# Patient Record
Sex: Female | Born: 1970 | Race: White | Hispanic: No | Marital: Single
Health system: Southern US, Community
[De-identification: ages and names within clinical notes are randomized; demographics above are authoritative.]

---

## 2019-12-28 ENCOUNTER — Emergency Department (HOSPITAL_COMMUNITY): Payer: BC Managed Care – PPO

## 2019-12-28 ENCOUNTER — Other Ambulatory Visit: Payer: Self-pay

## 2019-12-28 ENCOUNTER — Emergency Department (HOSPITAL_COMMUNITY)
Admission: EM | Admit: 2019-12-28 | Discharge: 2019-12-28 | Disposition: A | Discharge status: Home / Self Care | Payer: BC Managed Care – PPO | Attending: Emergency Medicine | Admitting: Emergency Medicine

## 2019-12-28 ENCOUNTER — Encounter (HOSPITAL_COMMUNITY): Payer: Self-pay

## 2019-12-28 DIAGNOSIS — N200 Calculus of kidney: Secondary | ICD-10-CM | POA: Insufficient documentation

## 2019-12-28 DIAGNOSIS — Z91040 Latex allergy status: Secondary | ICD-10-CM | POA: Insufficient documentation

## 2019-12-28 DIAGNOSIS — R1031 Right lower quadrant pain: Secondary | ICD-10-CM | POA: Diagnosis present

## 2019-12-28 LAB — COMPREHENSIVE METABOLIC PANEL
ALT: 12 U/L (ref 0–44)
AST: 21 U/L (ref 15–41)
Albumin: 4.1 g/dL (ref 3.5–5.0)
Alkaline Phosphatase: 82 U/L (ref 38–126)
Anion gap: 7 (ref 5–15)
BUN: 6 mg/dL (ref 6–20)
CO2: 26 mmol/L (ref 22–32)
Calcium: 8.4 mg/dL — ABNORMAL LOW (ref 8.9–10.3)
Chloride: 107 mmol/L (ref 98–111)
Creatinine, Ser: 0.69 mg/dL (ref 0.44–1.00)
GFR calc Af Amer: >60 mL/min (ref >60)
GFR calc non Af Amer: >60 mL/min (ref >60)
Glucose, Bld: 123 mg/dL — ABNORMAL HIGH (ref 70–99)
Potassium: 4 mmol/L (ref 3.5–5.1)
Sodium: 140 mmol/L (ref 135–145)
Total Bilirubin: 0.7 mg/dL (ref 0.3–1.2)
Total Protein: 6.7 g/dL (ref 6.5–8.1)

## 2019-12-28 LAB — CBC
HCT: 40.2 % (ref 36.0–46.0)
Hemoglobin: 13.4 g/dL (ref 12.0–15.0)
MCH: 33.4 pg (ref 26.0–34.0)
MCHC: 33.3 g/dL (ref 30.0–36.0)
MCV: 100.2 fL — ABNORMAL HIGH (ref 80.0–100.0)
Platelets: 202 10*3/uL (ref 150–400)
RBC: 4.01 MIL/uL (ref 3.87–5.11)
RDW: 13.5 % (ref 11.5–15.5)
WBC: 9 10*3/uL (ref 4.0–10.5)
nRBC: 0 % (ref 0.0–0.2)

## 2019-12-28 LAB — URINALYSIS, ROUTINE W REFLEX MICROSCOPIC
Bilirubin Urine: NEGATIVE
Glucose, UA: NEGATIVE mg/dL
Ketones, ur: 5 mg/dL — AB
Nitrite: NEGATIVE
Protein, ur: 30 mg/dL — AB
RBC / HPF: >50 RBC/hpf — ABNORMAL HIGH (ref 0–5)
Specific Gravity, Urine: 1.017 (ref 1.005–1.030)
pH: 6 (ref 5.0–8.0)

## 2019-12-28 LAB — I-STAT BETA HCG BLOOD, ED (MC, WL, AP ONLY): I-stat hCG, quantitative: <5 m[IU]/mL (ref <5)

## 2019-12-28 LAB — LIPASE, BLOOD: Lipase: 26 U/L (ref 11–51)

## 2019-12-28 MED ORDER — ONDANSETRON HCL 4 MG/2ML IJ SOLN
4.0000 mg | Freq: Once | INTRAMUSCULAR | Status: AC
  Administered 2019-12-28: 4 mg via INTRAVENOUS
  Filled 2019-12-28: qty 2

## 2019-12-28 MED ORDER — SODIUM CHLORIDE 0.9% FLUSH
3.0000 mL | Freq: Once | INTRAVENOUS | Status: AC
  Administered 2019-12-28: 3 mL via INTRAVENOUS

## 2019-12-28 MED ORDER — ONDANSETRON HCL 4 MG PO TABS: 4.0000 mg | ORAL_TABLET | Freq: Three times a day (TID) | ORAL | 0 refills | Status: AC | PRN

## 2019-12-28 MED ORDER — MORPHINE SULFATE (PF) 4 MG/ML IV SOLN
4.0000 mg | Freq: Once | INTRAVENOUS | Status: AC
  Administered 2019-12-28: 4 mg via INTRAVENOUS
  Filled 2019-12-28: qty 1

## 2019-12-28 MED ORDER — SODIUM CHLORIDE 0.9 % IV BOLUS
1000.0000 mL | Freq: Once | INTRAVENOUS | Status: AC
  Administered 2019-12-28: 1000 mL via INTRAVENOUS

## 2019-12-28 MED ORDER — TAMSULOSIN HCL 0.4 MG PO CAPS: 0.4000 mg | ORAL_CAPSULE | Freq: Every day | ORAL | 0 refills | Status: AC

## 2019-12-28 MED ORDER — KETOROLAC TROMETHAMINE 30 MG/ML IJ SOLN
30.0000 mg | Freq: Once | INTRAMUSCULAR | Status: AC
  Administered 2019-12-28: 30 mg via INTRAVENOUS
  Filled 2019-12-28: qty 1

## 2019-12-28 MED ORDER — CEPHALEXIN 500 MG PO CAPS: 500.0000 mg | ORAL_CAPSULE | Freq: Two times a day (BID) | ORAL | 0 refills | Status: AC

## 2019-12-28 MED ORDER — OXYCODONE HCL 5 MG PO TABS: 5.0000 mg | ORAL_TABLET | Freq: Four times a day (QID) | ORAL | 0 refills | Status: AC | PRN

## 2019-12-28 MED ORDER — HYDROMORPHONE HCL 1 MG/ML IJ SOLN
1.0000 mg | Freq: Once | INTRAMUSCULAR | Status: AC
  Administered 2019-12-28: 1 mg via INTRAVENOUS
  Filled 2019-12-28: qty 1

## 2019-12-28 NOTE — ED Notes (Signed)
EDP at bedside  

## 2019-12-28 NOTE — ED Provider Notes (Addendum)
[facility] DEPT Provider Note   CSN: [id number] Arrival date & time: 12/28/19  0755     History Chief Complaint  Patient presents with  . Abdominal Pain    Chelsey Jennings is a 49 y.o. female.  The history is provided by the patient.  Abdominal Pain Pain location:  RLQ and R flank Pain quality: not aching and not dull   Pain radiates to:  Does not radiate Pain severity:  Mild Onset quality:  Sudden Timing:  Constant Progression:  Unchanged Chronicity:  New Context: not previous surgeries   Relieved by:  Nothing Worsened by:  Nothing Ineffective treatments:  None tried Associated symptoms: nausea   Associated symptoms: no anorexia, no belching, no chest pain, no chills, no constipation, no cough, no diarrhea, no dysuria, no fatigue, no fever, no flatus, no hematemesis, no hematochezia, no hematuria, no melena, no shortness of breath, no sore throat, no vaginal bleeding, no vaginal discharge and no vomiting   Risk factors: has not had multiple surgeries and not pregnant        History reviewed. No pertinent past medical history.  There are no problems to display for this patient.   History reviewed. No pertinent surgical history.   OB History   No obstetric history on file.     No family history on file.  Social History   Tobacco Use  . Smoking status: Not on file  Substance Use Topics  . Alcohol use: Not on file  . Drug use: Not on file    Home Medications Prior to Admission medications   Medication Sig Start Date End Date Taking? Authorizing Provider  ibuprofen (ADVIL) 200 MG tablet Take 1,200 mg by mouth every 6 (six) hours as needed for fever, headache or mild pain.   Yes [provider]  cephALEXin (KEFLEX) 500 MG capsule Take 1 capsule (500 mg total) by mouth 2 (two) times daily for 7 days. 12/28/19 01/04/20  Jonovan Boedecker, DO  ondansetron (ZOFRAN) 4 MG tablet Take 1 tablet (4 mg total) by mouth every 8  (eight) hours as needed for nausea or vomiting. 12/28/19   Rogan Ecklund, DO  oxyCODONE (ROXICODONE) 5 MG immediate release tablet Take 1 tablet (5 mg total) by mouth every 6 (six) hours as needed for up to 15 doses for severe pain or breakthrough pain. 12/28/19   Terina Mcelhinny, DO  tamsulosin (FLOMAX) 0.4 MG CAPS capsule Take 1 capsule (0.4 mg total) by mouth daily for 5 days. 12/28/19 01/02/20  Olsen Mccutchan, DO    Allergies    Latex and Erythromycin  Review of Systems   Review of Systems  Constitutional: Negative for chills, fatigue and fever.  HENT: Negative for ear pain and sore throat.   Eyes: Negative for pain and visual disturbance.  Respiratory: Negative for cough and shortness of breath.   Cardiovascular: Negative for chest pain and palpitations.  Gastrointestinal: Positive for abdominal pain and nausea. Negative for anorexia, constipation, diarrhea, flatus, hematemesis, hematochezia, melena and vomiting.  Genitourinary: Positive for flank pain. Negative for difficulty urinating, dysuria, frequency, genital sores, hematuria, menstrual problem, pelvic pain, vaginal bleeding, vaginal discharge and vaginal pain.  Musculoskeletal: Negative for arthralgias and back pain.  Skin: Negative for color change and rash.  Neurological: Negative for seizures and syncope.  All other systems reviewed and are negative.   Physical Exam Updated Vital Signs  ED Triage Vitals  Enc Vitals Group     BP 12/28/19 0759 123/83  Pulse Rate 12/28/19 0759 80     Resp 12/28/19 0759 (!) 22     Temp 12/28/19 0759 98.1 F (36.7 C)     Temp Source 12/28/19 0759 Oral     SpO2 12/28/19 0759 100 %     Weight 12/28/19 0759 135 lb (61.2 kg)     Height 12/28/19 0759 5\' 7"  (1.702 m)     Head Circumference --      Peak Flow --      Pain Score 12/28/19 0805 10     Pain Loc --      Pain Edu? --      Excl. in GC? --     Physical Exam Vitals and nursing note reviewed.  Constitutional:      General: She  is in acute distress.     Appearance: She is well-developed.  HENT:     Head: Normocephalic and atraumatic.  Eyes:     Extraocular Movements: Extraocular movements intact.     Conjunctiva/sclera: Conjunctivae normal.     Pupils: Pupils are equal, round, and reactive to light.  Cardiovascular:     Rate and Rhythm: Normal rate and regular rhythm.     Heart sounds: No murmur.  Pulmonary:     Effort: Pulmonary effort is normal. No respiratory distress.     Breath sounds: Normal breath sounds.  Abdominal:     General: There is no distension.     Palpations: Abdomen is soft.     Tenderness: There is abdominal tenderness in the right lower quadrant. There is right CVA tenderness and guarding.     Hernia: No hernia is present.  Musculoskeletal:     Cervical back: Neck supple.  Skin:    General: Skin is warm and dry.     Capillary Refill: Capillary refill takes less than 2 seconds.  Neurological:     General: No focal deficit present.     Mental Status: She is alert.  Psychiatric:        Mood and Affect: Mood normal.     ED Results / Procedures / Treatments   Labs (all labs ordered are listed, but only abnormal results are displayed) Labs Reviewed  COMPREHENSIVE METABOLIC PANEL - Abnormal; Notable for the following components:      Result Value   Glucose, Bld 123 (*)    Calcium 8.4 (*)    All other components within normal limits  CBC - Abnormal; Notable for the following components:   MCV 100.2 (*)    All other components within normal limits  URINALYSIS, ROUTINE W REFLEX MICROSCOPIC - Abnormal; Notable for the following components:   APPearance HAZY (*)    Hgb urine dipstick LARGE (*)    Ketones, ur 5 (*)    Protein, ur 30 (*)    Leukocytes,Ua TRACE (*)    RBC / HPF >50 (*)    Bacteria, UA RARE (*)    All other components within normal limits  URINE CULTURE  LIPASE, BLOOD  I-STAT BETA HCG BLOOD, ED ([facility], [facility], [facility] ONLY)    EKG None  Radiology CT RENAL STONE  STUDY  Result Date: 12/28/2019 CLINICAL DATA:  Abdominal distension, RIGHT lower quadrant abdominal pain starting this morning, rebound tenderness, pain rated 10/10, nausea, question kidney stone versus appendicitis EXAM: CT ABDOMEN AND PELVIS WITHOUT CONTRAST TECHNIQUE: Multidetector CT imaging of the abdomen and pelvis was performed following the standard protocol without IV contrast. Sagittal and coronal MPR images reconstructed from axial data set. Oral  contrast not administered for this indication. COMPARISON:  None FINDINGS: Lower chest: Lung bases clear Hepatobiliary: Gallbladder and liver normal appearance Pancreas: Normal appearance Spleen: Normal appearance Adrenals/Urinary Tract: Adrenal glands and LEFT kidney normal appearance. Tiny nonobstructing calculi inferior pole RIGHT kidney. RIGHT hydronephrosis and hydroureter with note of a 2 mm diameter calculus at the RIGHT ureterovesical junction. Bladder decompressed. Stomach/Bowel: Normal appendix. Stomach and bowel loops normal appearance. Vascular/Lymphatic: Atherosclerotic calcifications aorta and iliac arteries. Aorta normal caliber. No adenopathy. Reproductive: Remarkable Other: No free air or free fluid.  No hernia. Musculoskeletal: Osseous structures unremarkable. IMPRESSION: RIGHT hydronephrosis and hydroureter secondary to a 2 mm RIGHT ureterovesical junction calculus. Additional tiny nonobstructing calculi at inferior pole RIGHT kidney. Electronically Signed   By: Ulyses Southward M.D.   On: 12/28/2019 09:42    Procedures Procedures (including critical care time)  Medications Ordered in ED Medications  sodium chloride flush (NS) 0.9 % injection 3 mL (3 mLs Intravenous Given 12/28/19 0854)  morphine 4 MG/ML injection 4 mg (4 mg Intravenous Given 12/28/19 0833)  ondansetron (ZOFRAN) injection 4 mg (4 mg Intravenous Given 12/28/19 0833)  sodium chloride 0.9 % bolus 1,000 mL (0 mLs Intravenous Stopped 12/28/19 1005)  HYDROmorphone (DILAUDID)  injection 1 mg (1 mg Intravenous Given 12/28/19 0947)  ketorolac (TORADOL) 30 MG/ML injection 30 mg (30 mg Intravenous Given 12/28/19 1012)  ondansetron (ZOFRAN) injection 4 mg (4 mg Intravenous Given 12/28/19 1012)    ED Course  I have reviewed the triage vital signs and the nursing notes.  Pertinent labs & imaging results that were available during my care of the patient were reviewed by me and considered in my medical decision making (see chart for details).    MDM Rules/Calculators/A&P                      10:43 AM  Shaylinn Hladik is a 49 year old female with no significant medical history who presents to the ED with right lower abdominal pain, right flank pain that started this morning.  Patient with nausea.  Patient with normal vitals.  No fever.  No history of kidney stones, no history of abdominal surgeries.  Still has menstrual cycles.  Denies any vaginal discharge or bleeding.  Has right flank pain and right lower quadrant pain on exam.  No obvious Murphy sign.  Suspect possibly kidney stone versus appendicitis versus hepatobiliary process such as cholecystitis.  Will check lab work including CT scan abdomen pelvis.  Given the acuteness and severity of the pain suspect kidney stone will get renal study. Will get urinalysis.  IV pain medications given, IV nausea medicine, IV fluids given.  Will reevaluate.  10:43 AM Patient with 2 mm right-sided kidney stone.  Otherwise no significant anemia, lateral abnormality, kidney injury, leukocytosis.  Pain has improved following IV narcotics.  Patient to have narcotics, Zofran, Flomax sent to pharmacy.  Awaiting urinalysis.  Will give IV Toradol, IV Dilaudid, IV Zofran.  Anticipate discharge to home with follow-up with urology.   Urinalysis overall unremarkable.  Likely contamination.  Culture sent for.  Will conservatively start antibiotic.  Patient discharged in good condition.  Understands return precautions.  This chart was dictated using  voice recognition software.  Despite best efforts to proofread,  errors can occur which can change the documentation meaning.  Final Clinical Impression(s) / ED Diagnoses Final diagnoses:  Kidney stone    Rx / DC Orders ED Discharge Orders         Ordered  oxyCODONE (ROXICODONE) 5 MG immediate release tablet  Every 6 hours PRN     12/28/19 1000    ondansetron (ZOFRAN) 4 MG tablet  Every 8 hours PRN     12/28/19 1000    tamsulosin (FLOMAX) 0.4 MG CAPS capsule  Daily     12/28/19 1000    cephALEXin (KEFLEX) 500 MG capsule  2 times daily     12/28/19 1042           Shonnie Poudrier, DO 12/28/19 1044    Easter Kennebrew, DO 12/28/19 1044

## 2019-12-28 NOTE — ED Notes (Signed)
Pt transported to CT at this time.

## 2019-12-28 NOTE — ED Notes (Signed)
Per MD:  give Toradol in 20-30mins

## 2019-12-28 NOTE — Discharge Instructions (Addendum)
Take medications as prescribed.  No obvious urinary tract infection however urine culture has been sent.  I have conservatively started you on antibiotic just in case culture shows an infection.  Take pain medication as prescribed.  Follow-up with urology if pain continues.  Please return to the ED if pain worsens as well.

## 2019-12-28 NOTE — ED Notes (Signed)
Pt requesting to be discharged. Pt states "If I am going to be miserable, I can do that at home in my own bed" MD made aware that pt is requesting to leave. Per MD: Pt will be DC'ed once UA has resulted.

## 2019-12-28 NOTE — ED Triage Notes (Addendum)
Patient reports right lower abdominal pain that started this morning with rebound tenderness.  10/10 pain   C/O nausea     A/Ox4 Ambulatory in triage.

## 2019-12-28 NOTE — ED Notes (Signed)
Patient ambulating to RR without assist; patient has a steady gait.

## 2019-12-28 NOTE — ED Notes (Signed)
Patient made aware that we need a urine sample. Patient stated to  NT "if you was here 5 mins ago you could have got the pee out of the trash can". Patient given call bell and instructed on how to use it. Patient also stated "she is in too much pain to pay attention"

## 2019-12-28 NOTE — ED Notes (Signed)
Provider at bedside at this time. Pt. Given a warm blanket and is resting.

## 2019-12-28 NOTE — ED Notes (Signed)
NT came to this RN and stated that the pt had voided in trash can. Pt informed that this behavior is unacceptable as the pt had the call bell with in reach and on her bed. Pt instructed to use call bell for assistance.

## 2019-12-30 LAB — URINE CULTURE: Culture: >=100000 — AB

## 2021-02-02 IMAGING — CT CT RENAL STONE PROTOCOL
2 of 4 series · 16 of 46 positions shown, 18 images · non-contrast
Comparison: None

CLINICAL DATA: Abdominal distension, RIGHT lower quadrant abdominal
pain starting this morning, rebound tenderness, pain rated [DATE],
nausea, question kidney stone versus appendicitis

EXAM:
CT ABDOMEN AND PELVIS WITHOUT CONTRAST
TECHNIQUE: Multidetector CT imaging of the abdomen and pelvis was performed
following the standard protocol without IV contrast. Sagittal and
coronal MPR images reconstructed from axial data set. Oral contrast
not administered for this indication.

[Series 2: axial st · axial · 0.71mm/px · z∈[+916,+1266]mm · 13 of 80 slices shown, 15 images]
[im 5/80  soft-tissue]
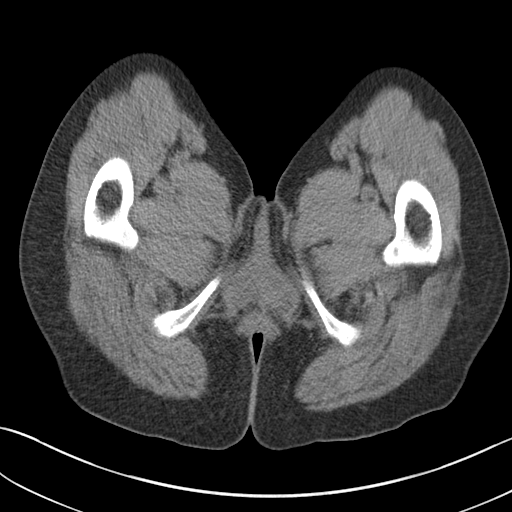
[im 5/80  bone]
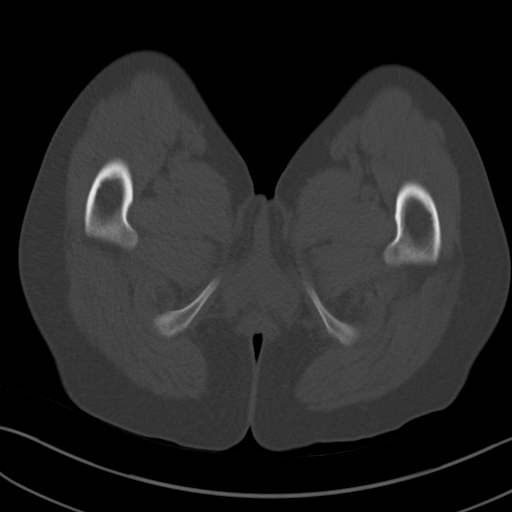
[im 13/80  soft-tissue]
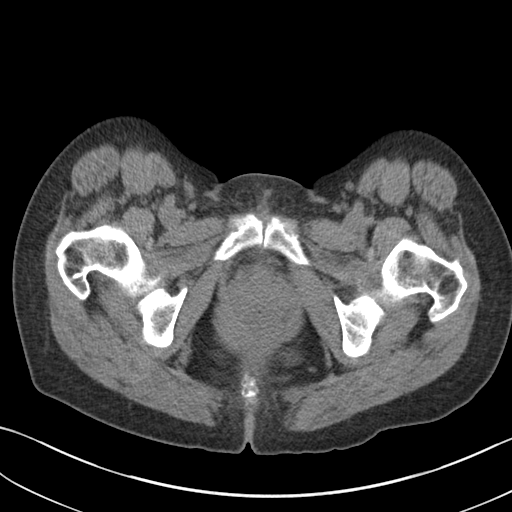
[im 17/80  soft-tissue]
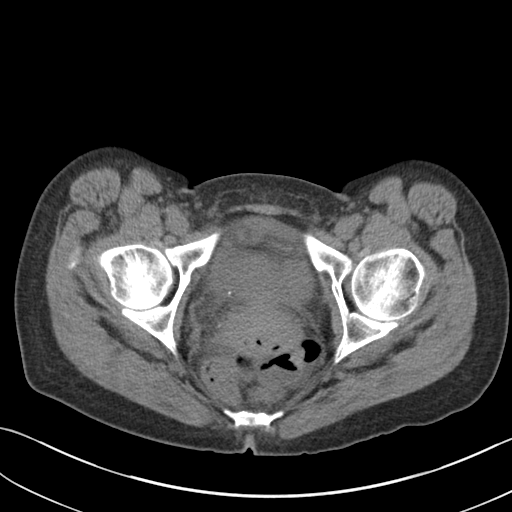
[im 21/80  soft-tissue]
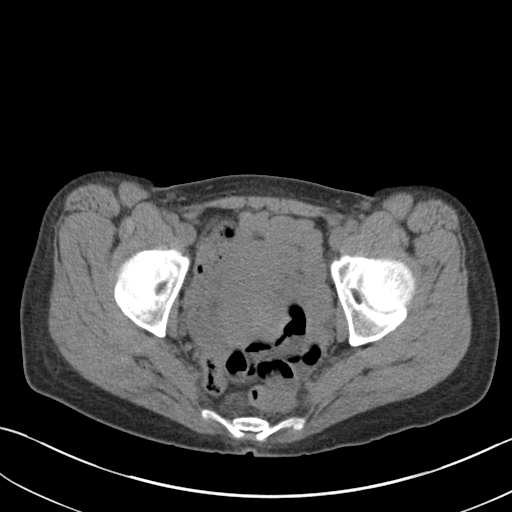
[im 30/80  soft-tissue]
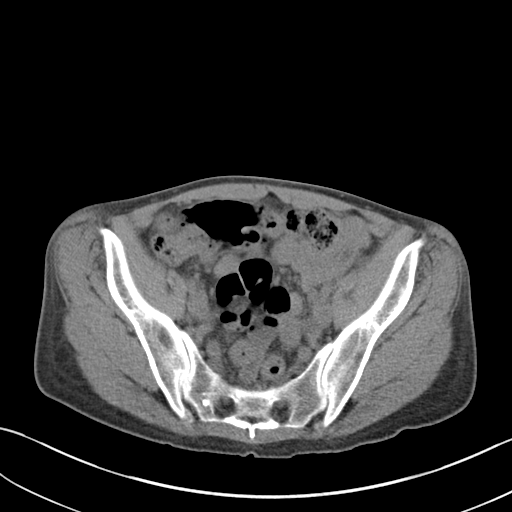
[im 34/80  soft-tissue]
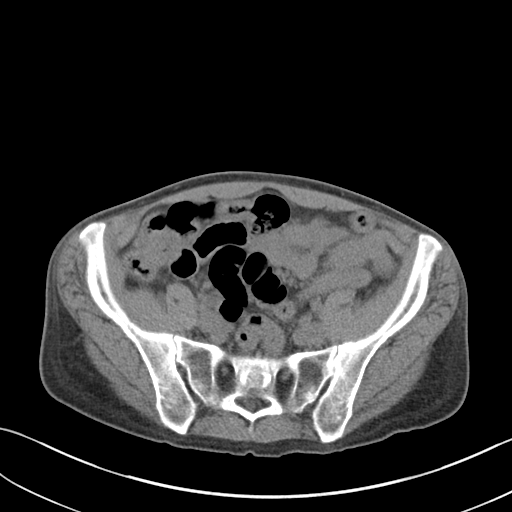
[im 42/80  soft-tissue]
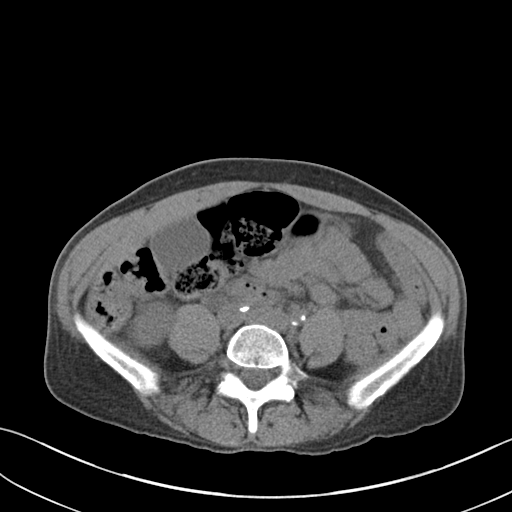
[im 46/80  soft-tissue]
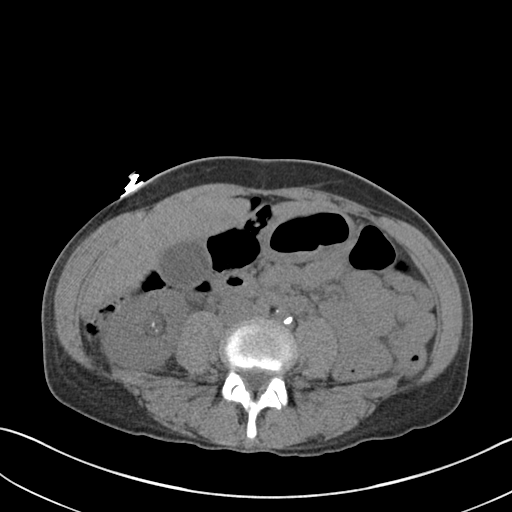
[im 50/80  soft-tissue]
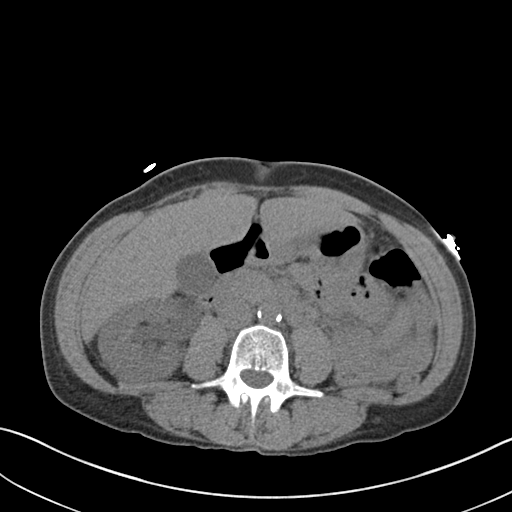
[im 50/80  bone]
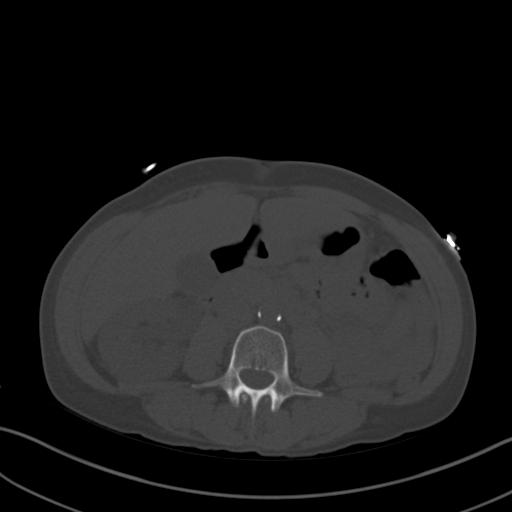
[im 59/80  soft-tissue]
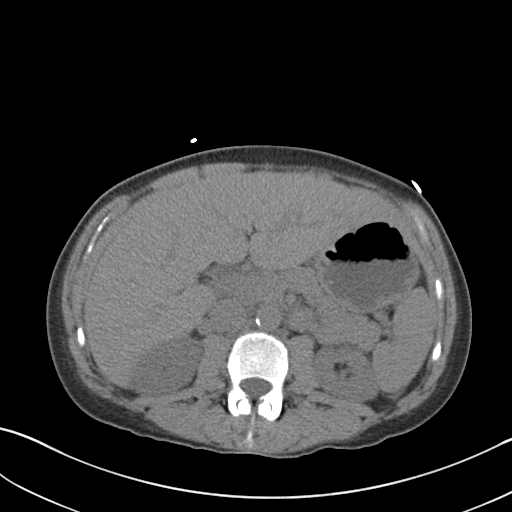
[im 63/80  soft-tissue]
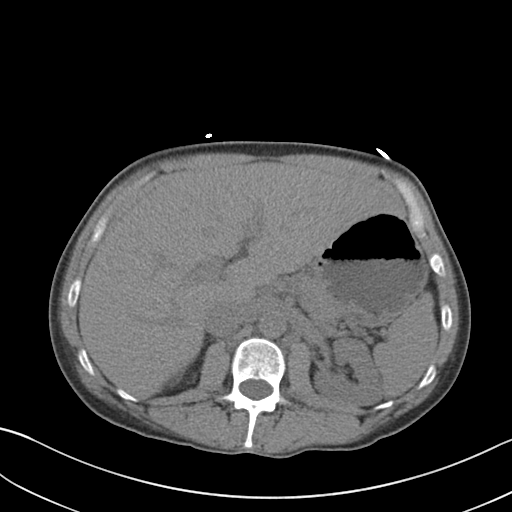
[im 67/80  soft-tissue]
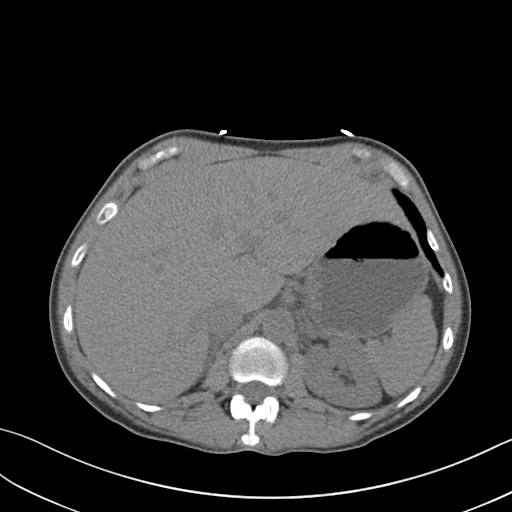
[im 75/80  soft-tissue]
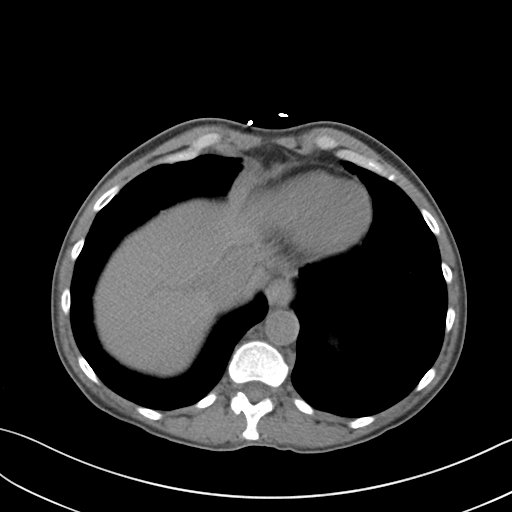

[Series 5: coronal · coronal · 0.66mm/px · 3 of 125 slices shown]
[im 42/125  soft-tissue]
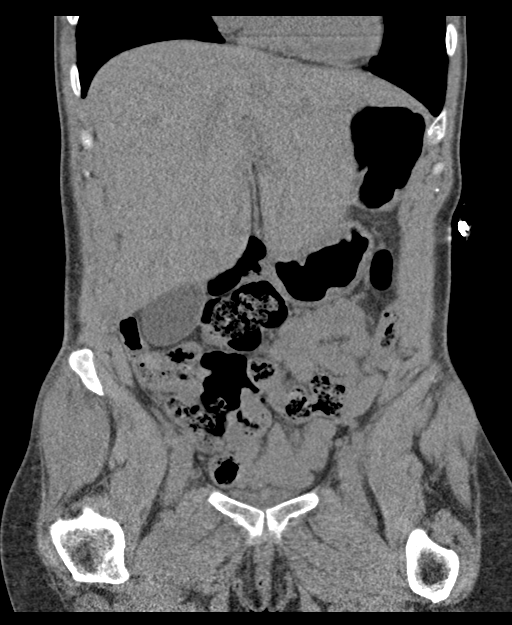
[im 56/125  soft-tissue]
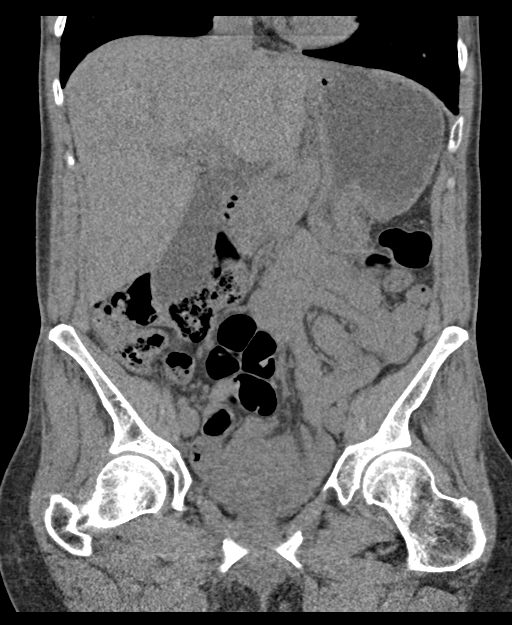
[im 69/125  soft-tissue]
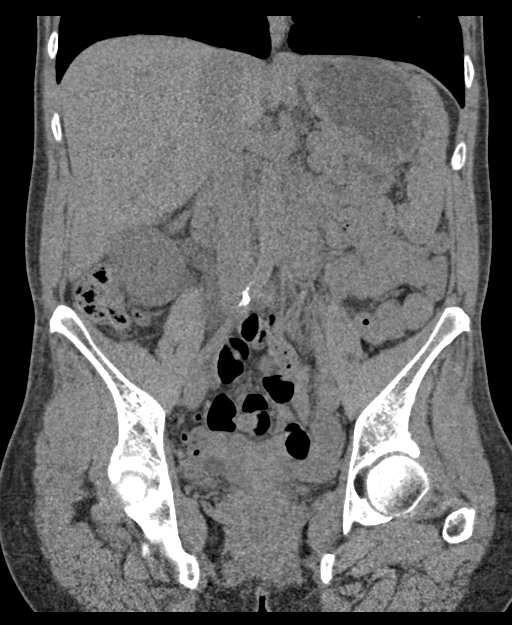

[16 of 46 positions shown; findings below may reference images not displayed]

FINDINGS: Lower chest: Lung bases clear

Hepatobiliary: Gallbladder and liver normal appearance

Pancreas: Normal appearance

Spleen: Normal appearance

Adrenals/Urinary Tract: Adrenal glands and LEFT kidney normal
appearance. Tiny nonobstructing calculi inferior pole RIGHT kidney.
RIGHT hydronephrosis and hydroureter with note of a 2 mm diameter
calculus at the RIGHT ureterovesical junction. Bladder decompressed.

Stomach/Bowel: Normal appendix. Stomach and bowel loops normal
appearance.

Vascular/Lymphatic: Atherosclerotic calcifications aorta and iliac
arteries. Aorta normal caliber. No adenopathy.

Reproductive: Remarkable

Other: No free air or free fluid.  No hernia.

Musculoskeletal: Osseous structures unremarkable.
IMPRESSION: RIGHT hydronephrosis and hydroureter secondary to a 2 mm RIGHT
ureterovesical junction calculus.

Additional tiny nonobstructing calculi at inferior pole RIGHT
kidney.
